# Patient Record
Sex: Female | Born: 1965 | Race: White | Hispanic: No | Marital: Single | Smoking: Former smoker
Health system: Southern US, Community
[De-identification: ages and names within clinical notes are randomized; demographics above are authoritative.]

## PROBLEM LIST (undated history)

## (undated) HISTORY — PX: DILATION AND CURETTAGE OF UTERUS: SHX78

## (undated) HISTORY — PX: TONSILLECTOMY: SUR1361

---

## 2004-08-03 ENCOUNTER — Observation Stay: Payer: Self-pay | Admitting: Unknown Physician Specialty

## 2004-11-04 ENCOUNTER — Inpatient Hospital Stay: Payer: Self-pay | Admitting: Obstetrics & Gynecology

## 2005-04-20 ENCOUNTER — Ambulatory Visit: Payer: Self-pay | Admitting: Unknown Physician Specialty

## 2005-06-25 ENCOUNTER — Ambulatory Visit: Payer: Self-pay | Admitting: Unknown Physician Specialty

## 2005-10-07 ENCOUNTER — Ambulatory Visit: Payer: Self-pay | Admitting: Obstetrics & Gynecology

## 2007-02-13 ENCOUNTER — Ambulatory Visit: Payer: Self-pay | Admitting: Gynecology

## 2007-02-23 ENCOUNTER — Ambulatory Visit (HOSPITAL_COMMUNITY): Admission: RE | Admit: 2007-02-23 | Discharge: 2007-02-23 | Payer: Self-pay | Admitting: Gynecology

## 2007-03-07 ENCOUNTER — Ambulatory Visit: Payer: Self-pay | Admitting: Family Medicine

## 2007-03-08 ENCOUNTER — Ambulatory Visit (HOSPITAL_COMMUNITY): Admission: RE | Admit: 2007-03-08 | Discharge: 2007-03-08 | Payer: Self-pay | Admitting: Gynecology

## 2007-03-09 ENCOUNTER — Ambulatory Visit (HOSPITAL_COMMUNITY): Admission: RE | Admit: 2007-03-09 | Discharge: 2007-03-09 | Payer: Self-pay | Admitting: Gynecology

## 2007-03-09 ENCOUNTER — Ambulatory Visit: Payer: Self-pay | Admitting: Gynecology

## 2007-03-09 ENCOUNTER — Encounter (INDEPENDENT_AMBULATORY_CARE_PROVIDER_SITE_OTHER): Payer: Self-pay | Admitting: Gynecology

## 2007-04-12 ENCOUNTER — Ambulatory Visit: Payer: Self-pay | Admitting: Obstetrics & Gynecology

## 2008-01-07 ENCOUNTER — Ambulatory Visit: Payer: Self-pay | Admitting: Family Medicine

## 2008-01-11 ENCOUNTER — Ambulatory Visit: Payer: Self-pay

## 2008-01-23 ENCOUNTER — Ambulatory Visit: Payer: Self-pay

## 2008-05-08 ENCOUNTER — Ambulatory Visit: Payer: Self-pay | Admitting: Family Medicine

## 2008-07-31 ENCOUNTER — Ambulatory Visit: Payer: Self-pay | Admitting: General Surgery

## 2009-02-10 ENCOUNTER — Ambulatory Visit: Payer: Self-pay

## 2009-04-25 ENCOUNTER — Ambulatory Visit: Payer: Self-pay | Admitting: Unknown Physician Specialty

## 2010-03-05 ENCOUNTER — Ambulatory Visit: Payer: Self-pay

## 2010-11-03 NOTE — Op Note (Signed)
NAMEJARITA, Kathy Roach            ACCOUNT NO.:  [id number]   MEDICAL RECORD NO.:  [id number]          PATIENT TYPE:  AMB   LOCATION:  SDC                           FACILITY:  [facility]   PHYSICIAN:  Ginger Carne, MD  DATE OF BIRTH:  [birth year]   DATE OF PROCEDURE:  03/09/2007  DATE OF DISCHARGE:                               OPERATIVE REPORT   PREOPERATIVE DIAGNOSIS:  First trimester missed abortion.   POSTOPERATIVE DIAGNOSIS:  First trimester missed abortion.   PROCEDURE:  Aspiration, dilation, extraction.   SURGEON:  Ginger Carne, MD   ASSISTANT:  None.   COMPLICATIONS:  None immediate.   ESTIMATED BLOOD LOSS:  Minimal.   DISPOSITION OF SPECIMEN:  To pathology.   SPECIMEN:  Products of conception.   OPERATIVE FINDINGS:  External genitalia, vulva and vagina normal.  Cervix smooth without erosions or lesions.  The uterus was about 7-8  weeks in size.  Both adnexa palpable, found to be normal.  Uterine  cavity was smooth, appropriately enlarged for 7-8 weeks.  Products of  conception noted.  The patient is Rh positive.   OPERATIVE PROCEDURE:  The patient prepped and draped in the usual  fashion and placed in lithotomy position.  Betadine solution used for  antiseptic.  The patient catheterized prior to the procedure.  After  adequate general anesthesia, a tenaculum placed on the anterior lip of  the cervix.  Dilatation to accommodate a #8 suction curette was  performed.  No specimen noted inside the cavity.  Minimal bleeding noted  at the end of the procedure.  The patient tolerated the procedure well  and returned to the post anesthesia recovery room in excellent  condition.      Ginger Carne, MD  Electronically Signed     SHB/MEDQ  D:  03/09/2007  T:  03/09/2007  Job:  [id number]

## 2011-03-25 ENCOUNTER — Ambulatory Visit: Payer: Self-pay | Admitting: Obstetrics and Gynecology

## 2011-04-01 LAB — CBC
MCHC: 35.8
RBC: 3.7 — ABNORMAL LOW

## 2012-03-30 ENCOUNTER — Ambulatory Visit: Payer: Self-pay | Admitting: Family Medicine

## 2012-06-01 ENCOUNTER — Ambulatory Visit: Payer: Self-pay | Admitting: Surgery

## 2012-06-01 ENCOUNTER — Emergency Department: Payer: Self-pay | Admitting: Emergency Medicine

## 2012-06-02 LAB — URINALYSIS, COMPLETE
Bilirubin,UR: NEGATIVE
Glucose,UR: NEGATIVE mg/dL (ref 0–75)
Nitrite: NEGATIVE
Ph: 7 (ref 4.5–8.0)
Protein: NEGATIVE
Specific Gravity: 1.004 (ref 1.003–1.030)

## 2012-06-03 LAB — PATHOLOGY REPORT

## 2013-04-26 ENCOUNTER — Ambulatory Visit: Payer: Self-pay | Admitting: Family Medicine

## 2013-05-24 ENCOUNTER — Ambulatory Visit: Payer: Self-pay | Admitting: Family Medicine

## 2014-05-23 ENCOUNTER — Ambulatory Visit: Payer: Self-pay | Admitting: Family Medicine

## 2014-05-31 ENCOUNTER — Ambulatory Visit: Payer: Self-pay | Admitting: Family Medicine

## 2014-07-26 ENCOUNTER — Encounter: Payer: Self-pay | Admitting: Podiatry

## 2014-07-26 ENCOUNTER — Ambulatory Visit: Payer: Self-pay

## 2014-07-26 ENCOUNTER — Ambulatory Visit (INDEPENDENT_AMBULATORY_CARE_PROVIDER_SITE_OTHER): Payer: BC Managed Care – PPO

## 2014-07-26 ENCOUNTER — Ambulatory Visit (INDEPENDENT_AMBULATORY_CARE_PROVIDER_SITE_OTHER): Payer: BC Managed Care – PPO | Admitting: Podiatry

## 2014-07-26 DIAGNOSIS — D3610 Benign neoplasm of peripheral nerves and autonomic nervous system, unspecified: Secondary | ICD-10-CM

## 2014-07-26 DIAGNOSIS — M7662 Achilles tendinitis, left leg: Secondary | ICD-10-CM

## 2014-07-26 DIAGNOSIS — M779 Enthesopathy, unspecified: Secondary | ICD-10-CM

## 2014-07-26 MED ORDER — TRIAMCINOLONE ACETONIDE 10 MG/ML IJ SUSP
10.0000 mg | Freq: Once | INTRAMUSCULAR | Status: AC
  Administered 2014-07-26: 10 mg

## 2014-07-26 NOTE — Patient Instructions (Signed)
Morton's Neuroma in Sports  (Interdigital Plantar Neuroma) Morton's neuroma is a condition of the nervous system that results in pain or loss of feeling in the toes. The disease is caused by the bones of the foot squeezing the nerve that runs between two toes (interdigital nerve). The third and fourth toes are most likely to be affected by this disease. SYMPTOMS   Tingling, numbness, burning, or electric shocks in the front of the foot, often involving the third and fourth toes, although it may involve any other pair of toes.  Pain and tenderness in the front of the foot, that gets worse when walking.  Pain that gets worse when pressure is applied to the foot (wearing shoes).  Severe pain in the front of the foot, when standing on the front of the foot (on tiptoes), such as with running, jumping, pivoting, or dancing. CAUSES  Morton's neuroma is caused by swelling of the nerve between two toes. This swelling causes the nerve to be pinched between the bones of the foot. RISK INCREASES WITH:  Recurring foot or ankle injuries.  Poor fitting or worn shoes, with minimal padding and shock absorbers.  Loose ligaments of the foot, causing thickening of the nerve.  Poor foot strength and flexibility. PREVENTION  Warm up and stretch properly before activity.  Maintain physical fitness:  Foot and ankle flexibility.  Muscle strength and endurance.  Cardiovascular fitness.  Wear properly fitted and padded shoes.  Wear arch supports (orthotics), when needed. PROGNOSIS  If treated properly, Morton's neuroma can usually be cured with non-surgical treatment. For certain cases, surgery may be needed. RELATED COMPLICATIONS  Permanent numbness and pain in the foot.  Inability to participate in athletics, because of pain. TREATMENT Treatment first involves stopping any activities that make the symptoms worse. The use of ice and medicine will help reduce pain and inflammation. Wearing shoes  with a wide toe box, and an orthotic arch support or metatarsal bar, may also reduce pain. Your caregiver may give you a corticosteroid injection, to further reduce inflammation. If non-surgical treatment is unsuccessful, surgery may be needed. Surgery to fix Morton's neuroma is often performed as an outpatient procedure, meaning you can go home the same day as the surgery. The procedure involves removing the source of pressure on the nerve. If it is necessary to remove the nerve, you can expect persistent numbness. MEDICATION  If pain medicine is needed, nonsteroidal anti-inflammatory medicines (aspirin and ibuprofen), or other minor pain relievers (acetaminophen), are often advised.  Do not take pain medicine for 7 days before surgery.  Prescription pain relievers are usually prescribed only after surgery. Use only as directed and only as much as you need.  Corticosteroid injections are used in extreme cases, to reduce inflammation. These injections should be done only if necessary, because they may be given only a limited number of times. HEAT AND COLD  Cold treatment (icing) should be applied for 10 to 15 minutes every 2 to 3 hours for inflammation and pain, and immediately after activity that aggravates your symptoms. Use ice packs or an ice massage.  Heat treatment may be used before performing stretching and strengthening activities prescribed by your caregiver, physical therapist, or athletic trainer. Use a heat pack or a warm water soak. SEEK MEDICAL CARE IF:   Symptoms get worse or do not improve in 2 weeks, despite treatment.  After surgery you develop increasing pain, swelling, redness, increased warmth, bleeding, drainage of fluids, or fever.  New, unexplained symptoms develop. (  Drugs used in treatment may produce side effects.) Document Released: 04/14/2005 Document Revised: 08/30/2011 Document Reviewed: 09/19/2008 [facility] Patient Information 2015 [place], [place]. This  information is not intended to replace advice given to you by your health care provider. Make sure you discuss any questions you have with your health care provider.    Achilles Tendinitis  with Rehab Achilles tendinitis is a disorder of the Achilles tendon. The Achilles tendon connects the large calf muscles (Gastrocnemius and Soleus) to the heel bone (calcaneus). This tendon is sometimes called the heel cord. It is important for pushing-off and standing on your toes and is important for walking, running, or jumping. Tendinitis is often caused by overuse and repetitive microtrauma. SYMPTOMS  Pain, tenderness, swelling, warmth, and redness may occur over the Achilles tendon even at rest.  Pain with pushing off, or flexing or extending the ankle.  Pain that is worsened after or during activity. CAUSES   Overuse sometimes seen with rapid increase in exercise programs or in sports requiring running and jumping.  Poor physical conditioning (strength and flexibility or endurance).  Running sports, especially training running down hills.  Inadequate warm-up before practice or play or failure to stretch before participation.  Injury to the tendon. PREVENTION   Warm up and stretch before practice or competition.  Allow time for adequate rest and recovery between practices and competition.  Keep up conditioning.  Keep up ankle and leg flexibility.  Improve or keep muscle strength and endurance.  Improve cardiovascular fitness.  Use proper technique.  Use proper equipment (shoes, skates).  To help prevent recurrence, taping, protective strapping, or an adhesive bandage may be recommended for several weeks after healing is complete. PROGNOSIS   Recovery may take weeks to several months to heal.  Longer recovery is expected if symptoms have been prolonged.  Recovery is usually quicker if the inflammation is due to a direct blow as compared with overuse or sudden strain. RELATED  COMPLICATIONS   Healing time will be prolonged if the condition is not correctly treated. The injury must be given plenty of time to heal.  Symptoms can reoccur if activity is resumed too soon.  Untreated, tendinitis may increase the risk of tendon rupture requiring additional time for recovery and possibly surgery. TREATMENT   The first treatment consists of rest anti-inflammatory medication, and ice to relieve the pain.  Stretching and strengthening exercises after resolution of pain will likely help reduce the risk of recurrence. Referral to a physical therapist or athletic trainer for further evaluation and treatment may be helpful.  A walking boot or cast may be recommended to rest the Achilles tendon. This can help break the cycle of inflammation and microtrauma.  Arch supports (orthotics) may be prescribed or recommended by your caregiver as an adjunct to therapy and rest.  Surgery to remove the inflamed tendon lining or degenerated tendon tissue is rarely necessary and has shown less than predictable results. MEDICATION   Nonsteroidal anti-inflammatory medications, such as aspirin and ibuprofen, may be used for pain and inflammation relief. Do not take within 7 days before surgery. Take these as directed by your caregiver. Contact your caregiver immediately if any bleeding, stomach upset, or signs of allergic reaction occur. Other minor pain relievers, such as acetaminophen, may also be used.  Pain relievers may be prescribed as necessary by your caregiver. Do not take prescription pain medication for longer than 4 to 7 days. Use only as directed and only as much as you need.  Cortisone  injections are rarely indicated. Cortisone injections may weaken tendons and predispose to rupture. It is better to give the condition more time to heal than to use them. HEAT AND COLD  Cold is used to relieve pain and reduce inflammation for acute and chronic Achilles tendinitis. Cold should be  applied for 10 to 15 minutes every 2 to 3 hours for inflammation and pain and immediately after any activity that aggravates your symptoms. Use ice packs or an ice massage.  Heat may be used before performing stretching and strengthening activities prescribed by your caregiver. Use a heat pack or a warm soak. SEEK MEDICAL CARE IF:  Symptoms get worse or do not improve in 2 weeks despite treatment.  New, unexplained symptoms develop. Drugs used in treatment may produce side effects.  EXERCISES:  RANGE OF MOTION (ROM) AND STRETCHING EXERCISES - Achilles Tendinitis  These exercises may help you when beginning to rehabilitate your injury. Your symptoms may resolve with or without further involvement from your physician, physical therapist or athletic trainer. While completing these exercises, remember:   Restoring tissue flexibility helps normal motion to return to the joints. This allows healthier, less painful movement and activity.  An effective stretch should be held for at least 30 seconds.  A stretch should never be painful. You should only feel a gentle lengthening or release in the stretched tissue.  STRETCH  Gastroc, Standing   Place hands on wall.  Extend right / left leg, keeping the front knee somewhat bent.  Slightly point your toes inward on your back foot.  Keeping your right / left heel on the floor and your knee straight, shift your weight toward the wall, not allowing your back to arch.  You should feel a gentle stretch in the right / left calf. Hold this position for 10 seconds. Repeat 3 times. Complete this stretch 2 times per day.  STRETCH  Soleus, Standing   Place hands on wall.  Extend right / left leg, keeping the other knee somewhat bent.  Slightly point your toes inward on your back foot.  Keep your right / left heel on the floor, bend your back knee, and slightly shift your weight over the back leg so that you feel a gentle stretch deep in your back  calf.  Hold this position for 10 seconds. Repeat 3 times. Complete this stretch 2 times per day.  STRETCH  Gastrocsoleus, Standing  Note: This exercise can place a lot of stress on your foot and ankle. Please complete this exercise only if specifically instructed by your caregiver.   Place the ball of your right / left foot on a step, keeping your other foot firmly on the same step.  Hold on to the wall or a rail for balance.  Slowly lift your other foot, allowing your body weight to press your heel down over the edge of the step.  You should feel a stretch in your right / left calf.  Hold this position for 10 seconds.  Repeat this exercise with a slight bend in your knee. Repeat 3 times. Complete this stretch 2 times per day.   STRENGTHENING EXERCISES - Achilles Tendinitis These exercises may help you when beginning to rehabilitate your injury. They may resolve your symptoms with or without further involvement from your physician, physical therapist or athletic trainer. While completing these exercises, remember:   Muscles can gain both the endurance and the strength needed for everyday activities through controlled exercises.  Complete these exercises as instructed  by your physician, physical therapist or athletic trainer. Progress the resistance and repetitions only as guided.  You may experience muscle soreness or fatigue, but the pain or discomfort you are trying to eliminate should never worsen during these exercises. If this pain does worsen, stop and make certain you are following the directions exactly. If the pain is still present after adjustments, discontinue the exercise until you can discuss the trouble with your clinician.  STRENGTH - Plantar-flexors   Sit with your right / left leg extended. Holding onto both ends of a rubber exercise band/tubing, loop it around the ball of your foot. Keep a slight tension in the band.  Slowly push your toes away from you, pointing  them downward.  Hold this position for 10 seconds. Return slowly, controlling the tension in the band/tubing. Repeat 3 times. Complete this exercise 2 times per day.   STRENGTH - Plantar-flexors   Stand with your feet shoulder width apart. Steady yourself with a wall or table using as little support as needed.  Keeping your weight evenly spread over the width of your feet, rise up on your toes.*  Hold this position for 10 seconds. Repeat 3 times. Complete this exercise 2 times per day.  *If this is too easy, shift your weight toward your right / left leg until you feel challenged. Ultimately, you may be asked to do this exercise with your right / left foot only.  STRENGTH  Plantar-flexors, Eccentric  Note: This exercise can place a lot of stress on your foot and ankle. Please complete this exercise only if specifically instructed by your caregiver.   Place the balls of your feet on a step. With your hands, use only enough support from a wall or rail to keep your balance.  Keep your knees straight and rise up on your toes.  Slowly shift your weight entirely to your right / left toes and pick up your opposite foot. Gently and with controlled movement, lower your weight through your right / left foot so that your heel drops below the level of the step. You will feel a slight stretch in the back of your calf at the end position.  Use the healthy leg to help rise up onto the balls of both feet, then lower weight only on the right / left leg again. Build up to 15 repetitions. Then progress to 3 consecutive sets of 15 repetitions.*  After completing the above exercise, complete the same exercise with a slight knee bend (about 30 degrees). Again, build up to 15 repetitions. Then progress to 3 consecutive sets of 15 repetitions.* Perform this exercise 2 times per day.  *When you easily complete 3 sets of 15, your physician, physical therapist or athletic trainer may advise you to add resistance by  wearing a backpack filled with additional weight.  STRENGTH - Plantar Flexors, Seated   Sit on a chair that allows your feet to rest flat on the ground. If necessary, sit at the edge of the chair.  Keeping your toes firmly on the ground, lift your right / left heel as far as you can without increasing any discomfort in your ankle. Repeat 3 times. Complete this exercise 2 times a day.

## 2014-07-26 NOTE — Progress Notes (Signed)
   Subjective:    Patient ID: Kathy Roach, female    DOB: Jun 14, 1966, 49 y.o.   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 She has been treated for neuroma in 2012 with injection therapy. Did relieve some symptoms. Just started to flare up, but not as bad.  Also, achilles tendon area left is sore, especially in the morning.   Concerned about 1st toenails bilateral looking discolored.   Patient has questions regarding shoe gear.     Review of Systems  HENT: Positive for sinus pressure.   Musculoskeletal: Positive for arthralgias and gait problem.  Hematological: Bruises/bleeds easily.  All other systems reviewed and are negative.      Objective:   Physical Exam        Assessment & Plan:

## 2014-07-28 NOTE — Progress Notes (Signed)
Subjective:     Patient ID: Kathy Roach, female   DOB: [birth year], 49 y.o.   MRN: [id number]  HPI patient presents stating that I am having a lot of pain in the back of my left heel I still get trouble with my forefoot on my left foot but not as bad and I am also concerned about my big toenails on both feet that look discolored. Patient states the heel is quite debilitating at this time and is making it hard for her to ambulate or wear shoe gear comfortably   Review of Systems  All other systems reviewed and are negative.      Objective:   Physical Exam  Constitutional: She is oriented to person, place, and time.  Cardiovascular: Intact distal pulses.   Musculoskeletal: Normal range of motion.  Neurological: She is oriented to person, place, and time.  Skin: Skin is warm.  Nursing note and vitals reviewed.  neurovascular status intact with muscle strength adequate and range of motion subtalar midtarsal joint within normal limits. Patient is noted to have mild equinus condition good digital perfusion and on the posterior lateral aspect of the left heel that is very tender when pressed and making it difficult to ambulate with mild to moderate edema noted in the area and slight increase in calor. No indication of Achilles tendon dysfunction or strength loss as far as musculature. There is mild positive Mulder sign left third interspace but no acute symptoms and slight discoloration on the distal portion of the hallux nailbeds bilateral     Assessment:     Acute Achilles tendinitis left with inflammation and also mild neuroma symptoms and minimal fungus formation left it's probably more trauma in nature    Plan:     H&P x-rays reviewed and condition discussed explained to patient. I've recommended aggressive conservative approach consisting of injection treatment and immobilization and I did explain the risk of procedure. Including with this I did discuss chances for rupture.  Patient wants the injection understanding risk and I did a careful lateral injection 3 mg dexamethasone Kenalog 5 mg Xylocaine being careful not to put it into the central or medial tendon and I then immobilized with cam walker instructing on reduced activity ice therapy and gradual increase in activity after several weeks. Reappoint 4 weeks to reevaluate

## 2014-10-08 ENCOUNTER — Ambulatory Visit (INDEPENDENT_AMBULATORY_CARE_PROVIDER_SITE_OTHER): Payer: BC Managed Care – PPO | Admitting: Podiatry

## 2014-10-08 DIAGNOSIS — M7662 Achilles tendinitis, left leg: Secondary | ICD-10-CM

## 2014-10-08 NOTE — Patient Instructions (Signed)

## 2014-10-08 NOTE — Op Note (Signed)
PATIENT NAME:  Kathy Roach, Kathy Roach MR#:  [id number] DATE OF BIRTH:  [birth year]  DATE OF PROCEDURE:  06/01/2012  PREOPERATIVE DIAGNOSIS: Hemorrhoids.   POSTOPERATIVE DIAGNOSIS: Hemorrhoids.   OPERATION: PPH stapled hemorrhoid pexy.   SURGEON: Rodena Goldmann, III, MD    ASSISTANT: Dr. Marina Gravel   ANESTHESIA: General.   OPERATIVE PROCEDURE: With the patient in the supine position after the induction of appropriate general anesthesia, the patient's perineal area was prepped and draped after appropriately placing her in the prone position. Betadine was utilized and the buttocks were taped apart. Sterile drapes were then applied. The rectal area was examined and appeared to be similar to the preoperative examination. The area was infiltrated with 30 mL of 0.25% Marcaine for sphincter relaxation and postoperative pain control. The obturator was placed without difficulty and sutured in place with 0 nylon. Purse-string suture was then placed using 2-0 Prolene in a circumferential fashion under direct vision. The purse-string appeared to be satisfactory testing against the finger. The stapler was brought to the table and the anvil inserted into the purse-string. The purse-string was then secured obliterating any visual evidence of the anvil. The purse-string was tied around the anvil and had its wings pass through the stapling device with the crochet hook. It was tied on the outside of the body of the stapler. The stapler was then approximated. 90 seconds were observed. Stapler was fired and another 60 seconds observed prior to releasing pressure on the rectal tissue. The ring was intact and the staple line was easily visualized. Several small bleeding spots were electrocauterized. The area was irrigated. Packing of Gelfoam and Avitene were inserted into the rectum. Sterile dressings were applied. The patient was returned to the recovery room having tolerated the procedure well. Sponge, instrument, and needle counts  were correct x2 in the operating room.  ____________________________ Rodena Goldmann III, MD rle:drc D: 06/01/2012 08:32:54 ET T: 06/01/2012 08:56:32 ET JOB#: [id number] Rodena Goldmann MD ELECTRONICALLY SIGNED 06/02/2012 16:59

## 2014-10-08 NOTE — Progress Notes (Signed)
Pt presents for orthotic pick up , orthotics dispensed written and verbal instructions are given

## 2014-11-12 ENCOUNTER — Ambulatory Visit: Payer: BC Managed Care – PPO

## 2015-05-07 ENCOUNTER — Other Ambulatory Visit: Payer: Self-pay | Admitting: Family Medicine

## 2015-05-07 DIAGNOSIS — Z1231 Encounter for screening mammogram for malignant neoplasm of breast: Secondary | ICD-10-CM

## 2015-06-03 ENCOUNTER — Ambulatory Visit
Admission: RE | Admit: 2015-06-03 | Discharge: 2015-06-03 | Disposition: A | Discharge status: Home / Self Care | Payer: BC Managed Care – PPO | Source: Ambulatory Visit | Attending: Family Medicine | Admitting: Family Medicine

## 2015-06-03 DIAGNOSIS — Z1231 Encounter for screening mammogram for malignant neoplasm of breast: Secondary | ICD-10-CM | POA: Insufficient documentation

## 2016-08-19 ENCOUNTER — Other Ambulatory Visit: Payer: Self-pay | Admitting: Family Medicine

## 2016-08-19 DIAGNOSIS — Z1231 Encounter for screening mammogram for malignant neoplasm of breast: Secondary | ICD-10-CM

## 2016-09-07 ENCOUNTER — Ambulatory Visit
Admission: RE | Admit: 2016-09-07 | Discharge: 2016-09-07 | Disposition: A | Discharge status: Home / Self Care | Payer: BC Managed Care – PPO | Source: Ambulatory Visit | Attending: Family Medicine | Admitting: Family Medicine

## 2016-09-07 DIAGNOSIS — Z1231 Encounter for screening mammogram for malignant neoplasm of breast: Secondary | ICD-10-CM | POA: Insufficient documentation

## 2016-11-03 ENCOUNTER — Ambulatory Visit (INDEPENDENT_AMBULATORY_CARE_PROVIDER_SITE_OTHER): Payer: BC Managed Care – PPO | Admitting: Podiatry

## 2016-11-03 ENCOUNTER — Encounter: Payer: Self-pay | Admitting: Podiatry

## 2016-11-03 ENCOUNTER — Ambulatory Visit (INDEPENDENT_AMBULATORY_CARE_PROVIDER_SITE_OTHER): Payer: BC Managed Care – PPO

## 2016-11-03 ENCOUNTER — Ambulatory Visit: Payer: BC Managed Care – PPO

## 2016-11-03 DIAGNOSIS — M722 Plantar fascial fibromatosis: Secondary | ICD-10-CM

## 2016-11-03 MED ORDER — MELOXICAM 15 MG PO TABS: 15.0000 mg | ORAL_TABLET | Freq: Every day | ORAL | 3 refills | Status: DC

## 2016-11-03 MED ORDER — METHYLPREDNISOLONE 4 MG PO TBPK: ORAL_TABLET | ORAL | 0 refills | Status: DC

## 2016-11-03 NOTE — Patient Instructions (Signed)

## 2016-11-03 NOTE — Progress Notes (Signed)
She persists with a chief complaint of plantar fasciitis bilateral. Has been present for several months. She states that is hard walk in the mornings that she's been sitting for a while.  Objective: Vital signs are stable she's alert and oriented 3. Pulses are strongly palpable. Neurologic sensorium is intact. Deep tendon reflexes are intact. Muscle strength is normal. Orthopedic evaluation of his result was distal to the ankle for range of motion but crepitation. She had failed palpation medial cartilage was bilateral heels radiographs do demonstrate soft tissue increase in density at the plantar fascia calcaneal insertion sites.  Assessment: Plantar fascitis bilateral.  Plan: Injected the bilateral plantar fascia today with Kenalog and local anesthetic. Start her on a Medrol Dosepak to be followed by meloxicam.

## 2016-12-06 ENCOUNTER — Ambulatory Visit: Payer: BC Managed Care – PPO | Admitting: Podiatry

## 2017-09-21 ENCOUNTER — Ambulatory Visit
Admission: EM | Admit: 2017-09-21 | Discharge: 2017-09-21 | Disposition: A | Discharge status: Home / Self Care | Payer: BC Managed Care – PPO | Attending: Family Medicine | Admitting: Family Medicine

## 2017-09-21 ENCOUNTER — Other Ambulatory Visit: Payer: Self-pay

## 2017-09-21 DIAGNOSIS — S0501XA Injury of conjunctiva and corneal abrasion without foreign body, right eye, initial encounter: Secondary | ICD-10-CM | POA: Diagnosis not present

## 2017-09-21 MED ORDER — ERYTHROMYCIN 5 MG/GM OP OINT: 1.0000 "application " | TOPICAL_OINTMENT | Freq: Four times a day (QID) | OPHTHALMIC | 0 refills | Status: AC

## 2017-09-21 NOTE — ED Triage Notes (Signed)
Patient complains of right eye injury that occurred when playing badminton with her daughter. Patient states that the birdie hit her in the right eye and she has had pain since.

## 2017-09-21 NOTE — ED Provider Notes (Signed)
[facility] ____________________________________________  Time seen: Approximately 7:44 PM  I have reviewed the triage vital signs and the nursing notes.   HISTORY  Chief Complaint Eye Injury (Right)   HPI Kathy Roach is a 52 y.o. female presenting for evaluation of right eye discomfort that occurred approximately 1-2 hours prior to arrival.  Patient reports that she was playing badminton with her daughter and missed which allowed the Birdie to then hit directly in her right eye.  Reports birdie was very light weight.  States right eye is not painful but more of an irritation.  States feels like there may be something in her eye but denies concern of foreign body.  Occasionally wears reading glasses over-the-counter, does not wear glasses or contacts otherwise.  Denies vision changes, except for states eyes more watery and when watery slightly blurry vision.  Otherwise denies vision changes.  States slight light sensitivity.  Denies any vision field loss, difficulty with range of motion.  Denies head injury or loss conscious.  Denies other complaints.  Reports otherwise feels well.  No alleviating measures attempted prior to arrival.  Reports tetanus simulation is up-to-date and last within 10 years.   Denies recent sickness. Denies recent antibiotic use.    History reviewed. No pertinent past medical history.  There are no active problems to display for this patient.   Past Surgical History:  Procedure Laterality Date  . DILATION AND CURETTAGE OF UTERUS    . TONSILLECTOMY       No current facility-administered medications for this encounter.   Current Outpatient Medications:  .  escitalopram (LEXAPRO) 10 MG tablet, Take 10 mg by mouth daily., Disp: , Rfl:  .  levothyroxine (SYNTHROID, LEVOTHROID) 150 MCG tablet, Take 125 mcg by mouth daily before breakfast. , Disp: , Rfl:  .  erythromycin ophthalmic ointment, Place 1 application into the right eye 4 (four)  times daily for 5 days. For five days, Disp: 20 g, Rfl: 0  Allergies Codeine  Family History  Problem Relation Age of Onset  . Arthritis Mother   . Diabetes Father   . Breast cancer Neg Hx     Social History Social History   Tobacco Use  . Smoking status: Former Research scientist (life sciences)  . Smokeless tobacco: Never Used  Substance Use Topics  . Alcohol use: Not Currently  . Drug use: Never    Review of Systems Constitutional: No fever/chills Eyes: As above.  ENT: No sore throat. Cardiovascular: Denies chest pain. Respiratory: Denies shortness of breath. Gastrointestinal: No abdominal pain.  Skin: Negative for rash.  ____________________________________________   PHYSICAL EXAM:  VITAL SIGNS: ED Triage Vitals  Enc Vitals Group     BP 09/21/17 1927 130/76     Pulse Rate 09/21/17 1927 81     Resp 09/21/17 1927 17     Temp 09/21/17 1927 98.1 F (36.7 C)     Temp Source 09/21/17 1927 Oral     SpO2 09/21/17 1927 99 %     Weight 09/21/17 1925 210 lb (95.3 kg)     Height 09/21/17 1925 5\' 7"  (1.702 m)     Head Circumference --      Peak Flow --      Pain Score 09/21/17 1925 3     Pain Loc --      Pain Edu? --      Excl. in GC? --      Visual Acuity  Right Eye Distance: 20/40(uncorrected) Left Eye Distance: 20/40(uncorrected) Bilateral Distance:  20/30(uncorrected)   Constitutional: Alert and oriented. Well appearing and in no acute distress. Eyes: Conjunctivae are normal. PERRL. EOMI. no pain with EOMs.  No foreign body visualized bilaterally.  Bilateral eyes nontender to palpation.  No surrounding tenderness, swelling or erythema bilaterally.  Right eye examined with fluorescein, corneal abrasion noted at 10-11 o'clock <0.25 cm.  ENT      Head: Normocephalic and atraumatic.      Nose: No congestion/rhinnorhea.      Mouth/Throat: Mucous membranes are moist.Oropharynx non-erythematous. Neck: No stridor. Supple without meningismus.  Hematological/Lymphatic/Immunilogical: No  cervical lymphadenopathy. Cardiovascular: Normal rate, regular rhythm. Grossly normal heart sounds.  Good peripheral circulation. Respiratory: Normal respiratory effort without tachypnea nor retractions. Breath sounds are clear and equal bilaterally. No wheezes, rales, rhonchi. Musculoskeletal:  Steady gait.  Neurologic:  Normal speech and language. Speech is normal. No gait instability.  Skin:  Skin is warm, dry and intact. No rash noted. Psychiatric: Mood and affect are normal. Speech and behavior are normal. Patient exhibits appropriate insight and judgment   ___________________________________________   LABS (all labs ordered are listed, but only abnormal results are displayed)  Labs Reviewed - No data to display   PROCEDURES Procedures  Eye exam Procedure explained and verbal consent obtained.  Anesthesia: tetracaine ophthalmic 2 drops Right eye examined with fluorescein strip.  No foreign bodies visualized.  Right corneal abrasion at 10-11 o'clock noted Patient tolerated well.    INITIAL IMPRESSION / ASSESSMENT AND PLAN / ED COURSE  Pertinent labs & imaging results that were available during my care of the patient were reviewed by me and considered in my medical decision making (see chart for details).  Well-appearing patient.  No acute distress.  Injury that occurred prior to arrival.  Reports tetanus immunization is up-to-date.  Corneal abrasion noted.  Will treat with erythromycin ophthalmic ointment.  Recommend for ophthalmology follow-up in 2 days.  Patient states that she will call to schedule tomorrow.  Discussed strict follow-up and return parameters.Discussed indication, risks and benefits of medications with patient.  Discussed follow up with Primary care physician this week. Discussed follow up and return parameters including no resolution or any worsening concerns. Patient verbalized understanding and agreed to plan.    ____________________________________________   FINAL CLINICAL IMPRESSION(S) / ED DIAGNOSES  Final diagnoses:  Abrasion of right cornea, initial encounter     ED Discharge Orders        Ordered    erythromycin ophthalmic ointment  4 times daily     09/21/17 2002       Note: This dictation was prepared with Dragon dictation along with smaller phrase technology. Any transcriptional errors that result from this process are unintentional.         Marylene Land, NP 09/21/17 2057

## 2017-09-21 NOTE — Discharge Instructions (Addendum)
Take medication as prescribed. Rest. Drink plenty of fluids.   Follow-up with ophthalmology in 2 days as discussed.  Call tomorrow to schedule.  Follow up with your primary care physician this week as needed. Return to Urgent care for new or worsening concerns.

## 2017-10-05 ENCOUNTER — Other Ambulatory Visit: Payer: Self-pay | Admitting: Family Medicine

## 2017-10-05 DIAGNOSIS — Z1231 Encounter for screening mammogram for malignant neoplasm of breast: Secondary | ICD-10-CM

## 2017-10-11 ENCOUNTER — Ambulatory Visit
Admission: RE | Admit: 2017-10-11 | Discharge: 2017-10-11 | Disposition: A | Discharge status: Home / Self Care | Payer: BC Managed Care – PPO | Source: Ambulatory Visit | Attending: Family Medicine | Admitting: Family Medicine

## 2017-10-11 DIAGNOSIS — Z1231 Encounter for screening mammogram for malignant neoplasm of breast: Secondary | ICD-10-CM | POA: Diagnosis not present

## 2018-12-04 ENCOUNTER — Other Ambulatory Visit: Payer: Self-pay | Admitting: Family Medicine

## 2018-12-04 DIAGNOSIS — Z1231 Encounter for screening mammogram for malignant neoplasm of breast: Secondary | ICD-10-CM

## 2018-12-13 ENCOUNTER — Inpatient Hospital Stay: Admission: RE | Admit: 2018-12-13 | Payer: BC Managed Care – PPO | Source: Ambulatory Visit

## 2019-01-31 ENCOUNTER — Ambulatory Visit
Admission: RE | Admit: 2019-01-31 | Discharge: 2019-01-31 | Disposition: A | Discharge status: Home / Self Care | Payer: BC Managed Care – PPO | Source: Ambulatory Visit | Attending: Family Medicine | Admitting: Family Medicine

## 2019-01-31 ENCOUNTER — Other Ambulatory Visit: Payer: Self-pay

## 2019-01-31 DIAGNOSIS — Z1231 Encounter for screening mammogram for malignant neoplasm of breast: Secondary | ICD-10-CM | POA: Diagnosis present

## 2019-09-10 ENCOUNTER — Other Ambulatory Visit: Payer: Self-pay

## 2019-09-10 DIAGNOSIS — Z20822 Contact with and (suspected) exposure to covid-19: Secondary | ICD-10-CM

## 2019-09-11 LAB — SARS-COV-2, NAA 2 DAY TAT

## 2019-09-11 LAB — NOVEL CORONAVIRUS, NAA: SARS-CoV-2, NAA: NOT DETECTED

## 2020-03-13 ENCOUNTER — Other Ambulatory Visit: Payer: Self-pay | Admitting: Family Medicine

## 2020-03-13 DIAGNOSIS — Z1231 Encounter for screening mammogram for malignant neoplasm of breast: Secondary | ICD-10-CM

## 2020-03-25 ENCOUNTER — Other Ambulatory Visit: Payer: Self-pay

## 2020-03-25 ENCOUNTER — Ambulatory Visit
Admission: RE | Admit: 2020-03-25 | Discharge: 2020-03-25 | Disposition: A | Discharge status: Home / Self Care | Payer: BC Managed Care – PPO | Source: Ambulatory Visit | Attending: Family Medicine | Admitting: Family Medicine

## 2020-03-25 DIAGNOSIS — Z1231 Encounter for screening mammogram for malignant neoplasm of breast: Secondary | ICD-10-CM | POA: Insufficient documentation

## 2020-05-07 ENCOUNTER — Ambulatory Visit (INDEPENDENT_AMBULATORY_CARE_PROVIDER_SITE_OTHER): Payer: BC Managed Care – PPO | Admitting: Obstetrics and Gynecology

## 2020-05-07 ENCOUNTER — Encounter: Payer: Self-pay | Admitting: Obstetrics and Gynecology

## 2020-05-07 ENCOUNTER — Other Ambulatory Visit: Payer: Self-pay

## 2020-05-07 VITALS — BP 97/61 | Ht 66.0 in | Wt 209.0 lb

## 2020-05-07 DIAGNOSIS — N951 Menopausal and female climacteric states: Secondary | ICD-10-CM | POA: Diagnosis not present

## 2020-05-07 DIAGNOSIS — Z7189 Other specified counseling: Secondary | ICD-10-CM

## 2020-05-07 MED ORDER — DUAVEE 0.45-20 MG PO TABS
1.0000 | ORAL_TABLET | Freq: Every day | ORAL | 11 refills | Status: AC
Start: 2020-05-07 — End: ?

## 2020-05-07 NOTE — Progress Notes (Signed)
Menopause, RM 2

## 2020-05-07 NOTE — Patient Instructions (Signed)
We discussed WHI study findings in detail.  In the combined estrogen-progesterone arm breast cancer risk was increased by 1.26 (CI of 1.00 to 1.59), coronary heart disease 1.29 (CI 1.02-1.63), stroke risk 1.41 (1.07-1.85), and pulmonary embolism 2.13 (CI 1.39-3.25).  That being said the while statistically significant the actual number of cases attributable are relatively small at an additional 8 cases of breast cancer, 7 more coronary artery event, 8 more strokes, and 8 additional case of pulmonary embolism per 10,000 women.  Study was terminated because of the increased breast cancer risk, this was not seen in the estrogen only arm of the study for women without an intact uterus.  In addition it is important to note that HRT also had positive or risk reducing effects, and all cause mortality between the HRT/non-HRT users is not statistically different.  Estrogen-progestin HRT decreased the relative risk of hip fracture 0.66 (CI 0.45-0.98), colorectal cancer 0.63 (0.43-0.92).  Current consensus is to limit dose to the lowest effective dose, and shortest treatment duration possible.  Breast cancer risk appeared to increase after 4 years of use.  Also important to note is that these risk refer to systemic HRT for the treatment of vasomotor symptoms, and do not apply to vaginal preperations with minimal systemic absorption and aimed at treating symptoms of vulvovaginal atrophy.    We briefly touched on findings of WHIMS trial published in 2005 which looked at women 65 year of age or older, and whether HRT was protective against the development of dementia.  The study revealed that HRT actually increased the risk for the development of dementia but was limited by looking only at patients 65 years of age and older.  The subsequent KEEPS trial  In 2012 which looked at HRT in recently postmenopausal women did not show any improvement in cognitive function for women on HRT.  However, there was also no significant  cognitive declines seen in recently postmenopausal women receiving HRT as had previously been shown in the WHIMS trial.    

## 2020-05-07 NOTE — Progress Notes (Signed)
Obstetrics & Gynecology Office Visit   Chief Complaint:  Chief Complaint  Patient presents with  . Menopause    History of Present Illness: 54 y.o. No obstetric history on file. presenting for initial evaluation of menopausal symptoms. Symptom onset was several years ago and symptoms have been worsening.  The patient is not still menstruating.  She is not currently on any HRT or hormonal medications.  Her surgical history is notable for intact uterus and ovaries.  She reports amenorrhea with no bleeding concerns.  Vasomotor symptoms significant with pronounced sleep disturbances, and limitations in quality of life.  Associated symptoms include fatigue, memory problems and vaginal dryness.  No contributory personal history of HTN, DVT/PE, liver dysfunction, or breast cancer.  She does have history of hypothyroidism and is established and monitored by endocrinology.  She has previously self treated or been under treatment by another provider for these symptoms. Has taken black cohosh with some improvement .   Review of Systems: Review of Systems  Constitutional: Negative.     Past Medical History:  There are no problems to display for this patient.   Past Surgical History:  Past Surgical History:  Procedure Laterality Date  . DILATION AND CURETTAGE OF UTERUS    . TONSILLECTOMY      Gynecologic History: No LMP recorded. Patient is perimenopausal.  Obstetric History: No obstetric history on file.  Family History:  Family History  Problem Relation Age of Onset  . Arthritis Mother   . Diabetes Father   . Breast cancer Neg Hx     Social History:  Social History   Socioeconomic History  . Marital status: Single    Spouse name: Not on file  . Number of children: Not on file  . Years of education: Not on file  . Highest education level: Not on file  Occupational History  . Not on file  Tobacco Use  . Smoking status: Former Research scientist (life sciences)  . Smokeless tobacco: Never Used    Vaping Use  . Vaping Use: Never used  Substance and Sexual Activity  . Alcohol use: Not Currently  . Drug use: Never  . Sexual activity: Not on file  Other Topics Concern  . Not on file  Social History Narrative  . Not on file   Social Determinants of Health   Financial Resource Strain:   . Difficulty of Paying Living Expenses: Not on file  Food Insecurity:   . Worried About Charity fundraiser in the Last Year: Not on file  . Ran Out of Food in the Last Year: Not on file  Transportation Needs:   . Lack of Transportation (Medical): Not on file  . Lack of Transportation (Non-Medical): Not on file  Physical Activity:   . Days of Exercise per Week: Not on file  . Minutes of Exercise per Session: Not on file  Stress:   . Feeling of Stress : Not on file  Social Connections:   . Frequency of Communication with Friends and Family: Not on file  . Frequency of Social Gatherings with Friends and Family: Not on file  . Attends Religious Services: Not on file  . Active Member of Clubs or Organizations: Not on file  . Attends Archivist Meetings: Not on file  . Marital Status: Not on file  Intimate Partner Violence:   . Fear of Current or Ex-Partner: Not on file  . Emotionally Abused: Not on file  . Physically Abused: Not on file  .  Sexually Abused: Not on file    Allergies:  Allergies  Allergen Reactions  . Codeine     Nausea     Medications: Prior to Admission medications   Medication Sig Start Date End Date Taking? Authorizing Provider  escitalopram (LEXAPRO) 10 MG tablet Take 10 mg by mouth daily.    [provider]  levothyroxine (SYNTHROID, LEVOTHROID) 150 MCG tablet Take 125 mcg by mouth daily before breakfast.     [provider]    Physical Exam Blood pressure 97/61, height 5\' 6"  (1.676 m), weight 209 lb (94.8 kg).  No LMP recorded. Patient is perimenopausal.  General: NAD, well nourished, appears state age 67: normocephalic,  anicteric Pulmonary: No increased work of breathing Neurologic: Grossly intact Psychiatric: mood appropriate, affect full  Female chaperone present for pelvic  portions of the physical exam  Assessment: 54 y.o. presenting for discussion of treatment option for vasomotor symptoms  Plan: Problem List Items Addressed This Visit    None    Visit Diagnoses    Vasomotor symptoms due to menopause    -  Primary   Counseling for hormone replacement therapy          We discussed WHI study findings in detail.  In the combined estrogen-progesterone arm breast cancer risk was increased by 1.26 (CI of 1.00 to 1.59), coronary heart disease 1.29 (CI 1.02-1.63), stroke risk 1.41 (1.07-1.85), and pulmonary embolism 2.13 (CI 1.39-3.25).  That being said the while statistically significant the actual number of cases attributable are relatively small at an addition 8 cases of breast cancer, 7 more coronary artery event, 8 more strokes, and 8 additional case of pulmonary embolism per 10,000 women.  Study was terminated because of the increased breast cancer risk, this was not seen in the progestin only arm of the study for women without an intact uterus.  In addition it is important to note that HRT also had positive or risk reducing effects, and all cause mortality between the HRT/non-HRT users is not statistically different.  Estrogen-progestin HRT decreased the relative risk of hip fracture 0.66 (CI 0.45-0.98), colorectal cancer 0.63 (0.43-0.92).  Current consensus is to limit dose to the lowest effective dose, and shortest treatment duration possible.  Breast cancer risk appeared to increase after 4 years of use.  Also important to note is that these risk refer to systemic HRT for the treatment of vasomotor symptoms, and do not apply to vaginal preperations with minimal systemic absorption and aimed at treating symptoms of vulvovaginal atrophy.    We briefly touched on findings of WHIMS trial published in 2005  which looked at women 31 year of age or older, and whether HRT was protective against the development of dementia.  The study revealed that HRT actually increased the risk for the development of dementia but was limited by looking only at patients 37 years of age and older.  The subsequent KEEPS trial  In 2012 which looked at HRT in recently postmenopausal women did not show any improvement in cognitive function for women on HRT.  However, there was also no significant cognitive declines seen in recently postmenopausal women receiving HRT as had previously been shown in the [facility] trial.   We disscues non-hormonal treatment options including clonidine (Catapres), gabapentin (Neurontin), as well as paroxitine (Brisdelle)   - pap UTD - mammogram UTD - Will start patient on Duavee   2) A total of 30 minutes were spent in face-to-face contact with the patient during this encounter with over  half of that time devoted to counseling and coordination of care.  3) Return in about 3 months (around 08/07/2020) for medication follow up phone.     Malachy Mood, MD, [facility], [facility] 05/07/2020, 2:02 PM

## 2020-08-06 ENCOUNTER — Ambulatory Visit: Payer: BC Managed Care – PPO | Admitting: Obstetrics and Gynecology

## 2021-03-17 ENCOUNTER — Other Ambulatory Visit: Payer: Self-pay | Admitting: Family Medicine

## 2021-03-17 DIAGNOSIS — Z1231 Encounter for screening mammogram for malignant neoplasm of breast: Secondary | ICD-10-CM

## 2021-04-06 ENCOUNTER — Other Ambulatory Visit: Payer: Self-pay

## 2021-04-06 ENCOUNTER — Ambulatory Visit
Admission: RE | Admit: 2021-04-06 | Discharge: 2021-04-06 | Disposition: A | Discharge status: Home / Self Care | Payer: BC Managed Care – PPO | Source: Ambulatory Visit | Attending: Family Medicine | Admitting: Family Medicine

## 2021-04-06 DIAGNOSIS — Z1231 Encounter for screening mammogram for malignant neoplasm of breast: Secondary | ICD-10-CM | POA: Insufficient documentation

## 2021-08-25 ENCOUNTER — Telehealth (INDEPENDENT_AMBULATORY_CARE_PROVIDER_SITE_OTHER): Payer: Self-pay

## 2021-08-25 NOTE — Telephone Encounter (Signed)
LVM for pt in response to her VM inquiring about services for Varicose veins. I explained that we would need a referral from her PCP/provider and if she had any questions to call us back.  ?

## 2022-03-31 ENCOUNTER — Other Ambulatory Visit: Payer: Self-pay | Admitting: Family Medicine

## 2022-03-31 DIAGNOSIS — Z1231 Encounter for screening mammogram for malignant neoplasm of breast: Secondary | ICD-10-CM

## 2022-04-19 ENCOUNTER — Encounter (INDEPENDENT_AMBULATORY_CARE_PROVIDER_SITE_OTHER): Payer: Self-pay

## 2022-04-20 ENCOUNTER — Ambulatory Visit
Admission: RE | Admit: 2022-04-20 | Discharge: 2022-04-20 | Disposition: A | Discharge status: Home / Self Care | Payer: BC Managed Care – PPO | Source: Ambulatory Visit | Attending: Family Medicine | Admitting: Family Medicine

## 2022-04-20 DIAGNOSIS — Z1231 Encounter for screening mammogram for malignant neoplasm of breast: Secondary | ICD-10-CM | POA: Insufficient documentation

## 2023-03-28 IMAGING — MG MM DIGITAL SCREENING BILAT W/ TOMO AND CAD
8 series · 8 of 24 positions shown · non-contrast
Comparison: Previous exam(s).

CLINICAL DATA: Screening.

EXAM:
DIGITAL SCREENING BILATERAL MAMMOGRAM WITH TOMOSYNTHESIS AND CAD
TECHNIQUE: Bilateral screening digital craniocaudal and mediolateral oblique
mammograms were obtained. Bilateral screening digital breast
tomosynthesis was performed. The images were evaluated with
computer-aided detection.

[L MLO synth-2D]
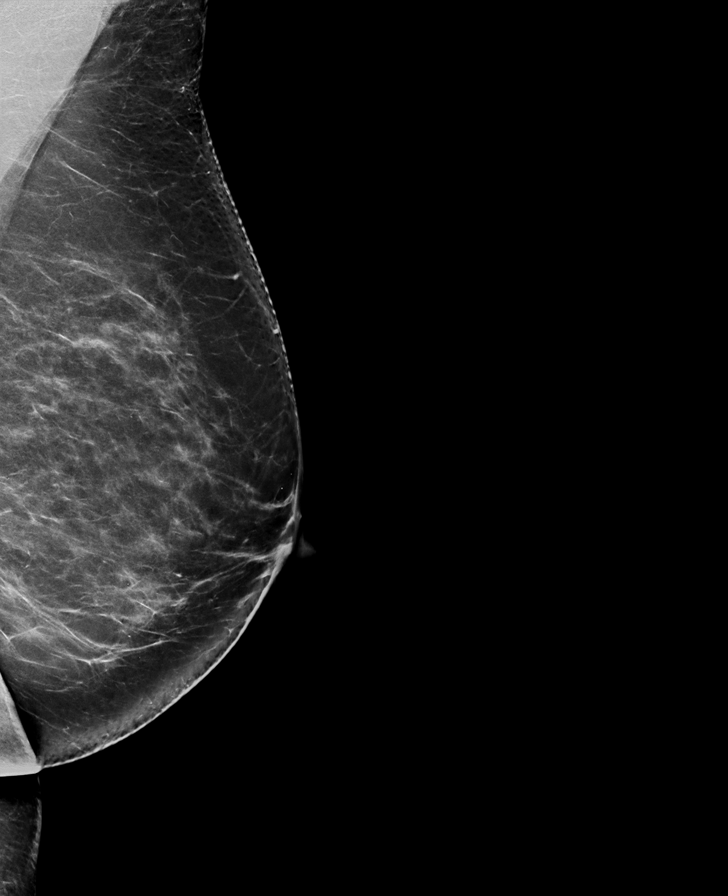

[L CC synth-2D]
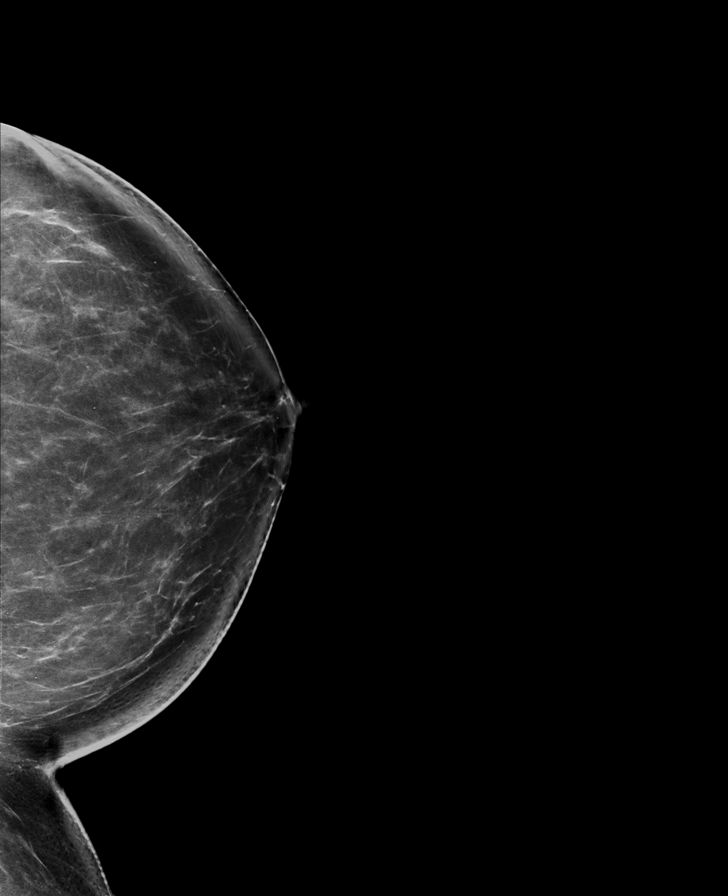

[R MLO synth-2D]
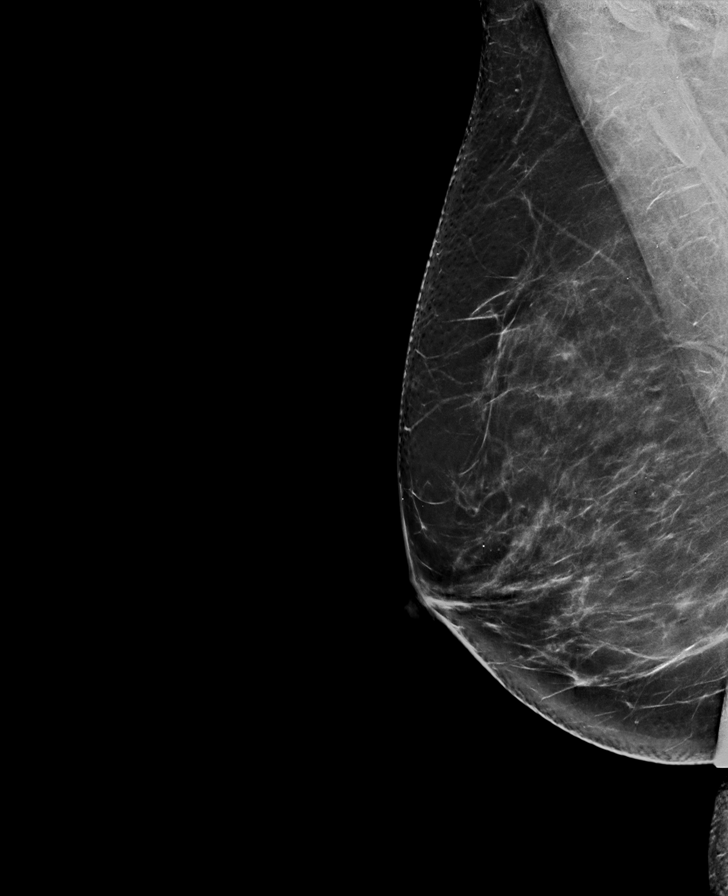

[R CC synth-2D]
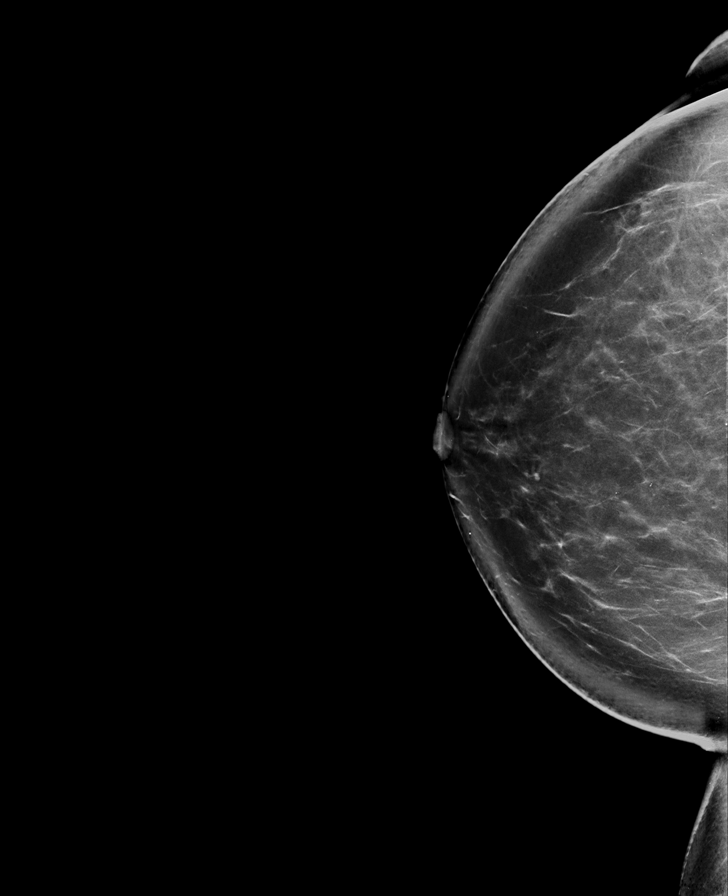

[R MLO tomo · tomo slice 45/90.0]
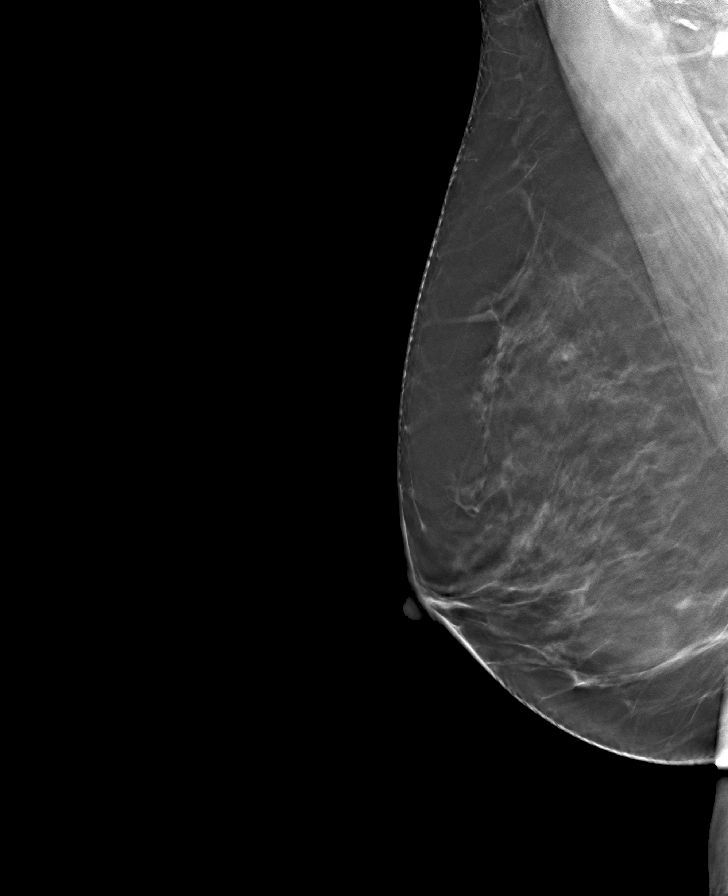

[L MLO tomo · tomo slice 44/87.0]
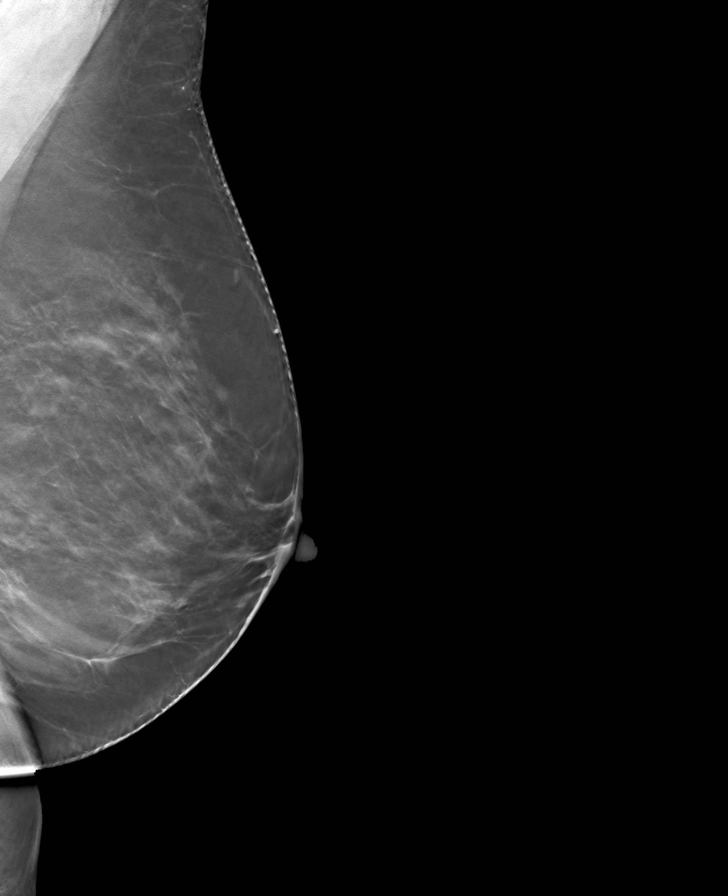

[R CC tomo · tomo slice 53/104.0]
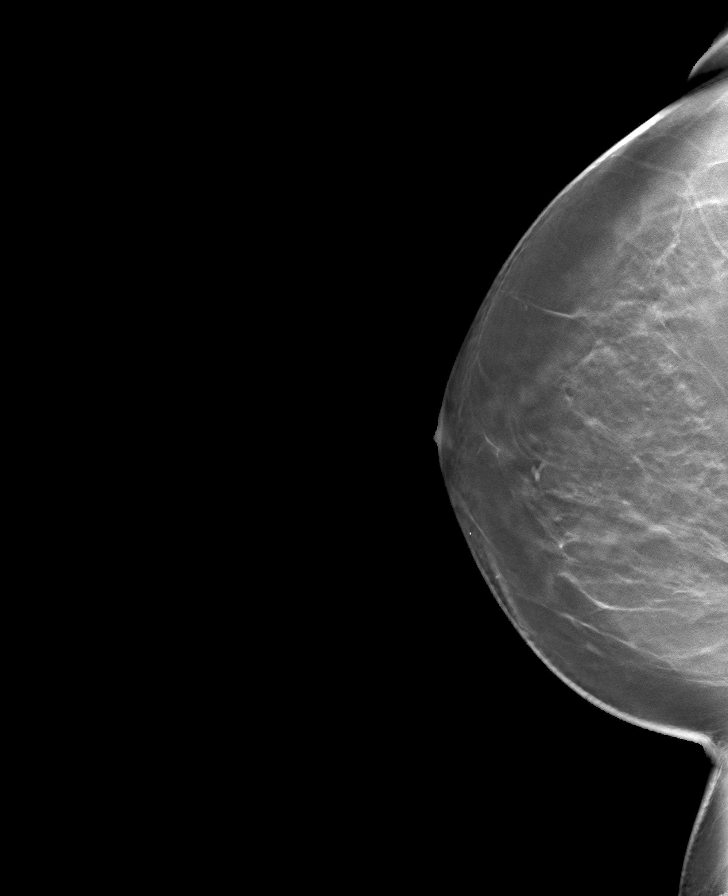

[L CC tomo · tomo slice 47/93.0]
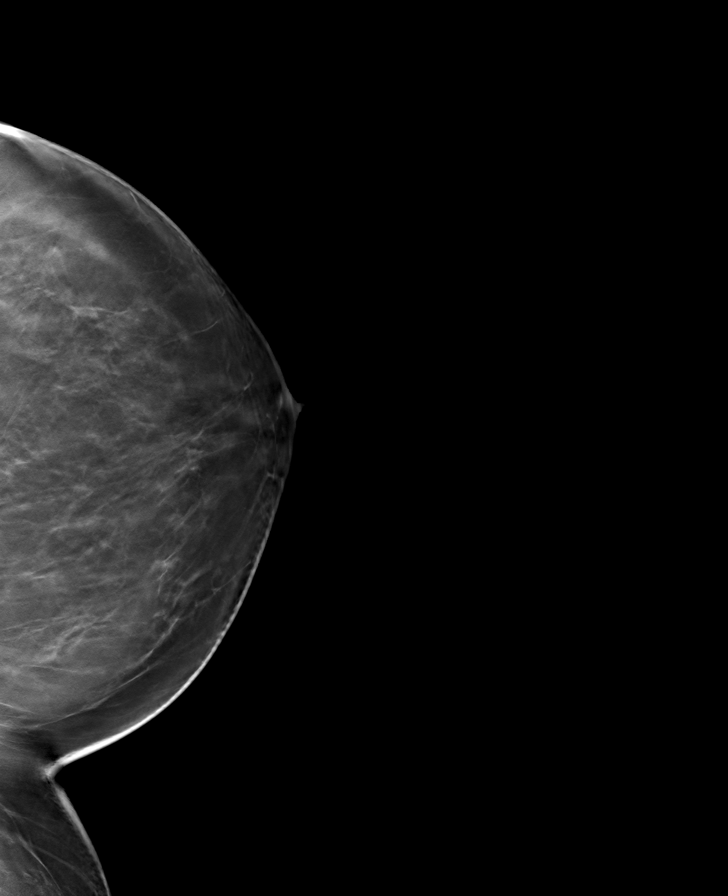

[8 of 24 positions shown; findings below may reference images not displayed]

ACR Breast Density Category b: There are scattered areas of
fibroglandular density.
FINDINGS: There are no findings suspicious for malignancy.
IMPRESSION: No mammographic evidence of malignancy. A result letter of this
screening mammogram will be mailed directly to the patient.

RECOMMENDATION:
Screening mammogram in one year. (Code:51-O-LD2)

BI-RADS CATEGORY  1: Negative.

## 2023-04-01 ENCOUNTER — Other Ambulatory Visit: Payer: Self-pay | Admitting: Family Medicine

## 2023-04-01 DIAGNOSIS — Z1231 Encounter for screening mammogram for malignant neoplasm of breast: Secondary | ICD-10-CM

## 2023-04-26 ENCOUNTER — Ambulatory Visit
Admission: RE | Admit: 2023-04-26 | Discharge: 2023-04-26 | Disposition: A | Discharge status: Home / Self Care | Payer: BC Managed Care – PPO | Source: Ambulatory Visit | Attending: Family Medicine | Admitting: Family Medicine

## 2023-04-26 DIAGNOSIS — Z1231 Encounter for screening mammogram for malignant neoplasm of breast: Secondary | ICD-10-CM | POA: Diagnosis present

## 2023-05-12 ENCOUNTER — Ambulatory Visit: Payer: BC Managed Care – PPO

## 2023-05-12 DIAGNOSIS — K640 First degree hemorrhoids: Secondary | ICD-10-CM | POA: Diagnosis not present

## 2023-05-12 DIAGNOSIS — D123 Benign neoplasm of transverse colon: Secondary | ICD-10-CM | POA: Diagnosis not present

## 2023-05-12 DIAGNOSIS — Z860101 Personal history of adenomatous and serrated colon polyps: Secondary | ICD-10-CM | POA: Diagnosis not present

## 2023-05-12 DIAGNOSIS — Z1211 Encounter for screening for malignant neoplasm of colon: Secondary | ICD-10-CM | POA: Diagnosis present
